# Patient Record
Sex: Female | Born: 1965 | Race: White | Hispanic: No | Marital: Married | Smoking: Never smoker
Health system: Southern US, Community
[De-identification: ages and names within clinical notes are randomized; demographics above are authoritative.]

## PROBLEM LIST (undated history)

## (undated) DIAGNOSIS — G4700 Insomnia, unspecified: Secondary | ICD-10-CM

## (undated) DIAGNOSIS — E1165 Type 2 diabetes mellitus with hyperglycemia: Secondary | ICD-10-CM

## (undated) DIAGNOSIS — E669 Obesity, unspecified: Secondary | ICD-10-CM

## (undated) DIAGNOSIS — J4540 Moderate persistent asthma, uncomplicated: Secondary | ICD-10-CM

## (undated) DIAGNOSIS — M545 Low back pain, unspecified: Secondary | ICD-10-CM

## (undated) DIAGNOSIS — T7840XS Allergy, unspecified, sequela: Secondary | ICD-10-CM

## (undated) DIAGNOSIS — J449 Chronic obstructive pulmonary disease, unspecified: Secondary | ICD-10-CM

## (undated) DIAGNOSIS — E785 Hyperlipidemia, unspecified: Secondary | ICD-10-CM

## (undated) DIAGNOSIS — J309 Allergic rhinitis, unspecified: Secondary | ICD-10-CM

## (undated) DIAGNOSIS — M5000 Cervical disc disorder with myelopathy, unspecified cervical region: Secondary | ICD-10-CM

## (undated) DIAGNOSIS — E7800 Pure hypercholesterolemia, unspecified: Secondary | ICD-10-CM

## (undated) DIAGNOSIS — K2100 Gastro-esophageal reflux disease with esophagitis, without bleeding: Secondary | ICD-10-CM

## (undated) DIAGNOSIS — F419 Anxiety disorder, unspecified: Secondary | ICD-10-CM

## (undated) DIAGNOSIS — F411 Generalized anxiety disorder: Secondary | ICD-10-CM

## (undated) DIAGNOSIS — M25532 Pain in left wrist: Secondary | ICD-10-CM

## (undated) DIAGNOSIS — E119 Type 2 diabetes mellitus without complications: Secondary | ICD-10-CM

## (undated) HISTORY — DX: Anxiety disorder, unspecified: F41.9

## (undated) HISTORY — DX: Morbid (severe) obesity due to excess calories: E66.01

## (undated) HISTORY — DX: Chronic obstructive pulmonary disease, unspecified: J44.9

## (undated) HISTORY — DX: Generalized anxiety disorder: F41.1

## (undated) HISTORY — DX: Cervical disc disorder with myelopathy, unspecified cervical region: M50.00

## (undated) HISTORY — DX: Low back pain, unspecified: M54.50

## (undated) HISTORY — PX: CHOLECYSTECTOMY: SHX55

## (undated) HISTORY — DX: Hyperlipidemia, unspecified: E78.5

## (undated) HISTORY — DX: Type 2 diabetes mellitus without complications: E11.9

## (undated) HISTORY — DX: Insomnia, unspecified: G47.00

## (undated) HISTORY — DX: Allergy, unspecified, sequela: T78.40XS

## (undated) HISTORY — DX: Allergic rhinitis, unspecified: J30.9

## (undated) HISTORY — DX: Pain in left wrist: M25.532

## (undated) HISTORY — DX: Obesity, unspecified: E66.9

## (undated) HISTORY — PX: TYMPANOSTOMY TUBE PLACEMENT: SHX32

## (undated) HISTORY — DX: Low back pain: M54.5

## (undated) HISTORY — DX: Pure hypercholesterolemia, unspecified: E78.00

## (undated) HISTORY — DX: Gastro-esophageal reflux disease with esophagitis, without bleeding: K21.00

## (undated) HISTORY — DX: Type 2 diabetes mellitus with hyperglycemia: E11.65

## (undated) HISTORY — DX: Moderate persistent asthma, uncomplicated: J45.40

---
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
## 2000-02-03 ENCOUNTER — Encounter: Admission: RE | Admit: 2000-02-03 | Discharge: 2000-02-03 | Payer: Self-pay

## 2000-12-29 ENCOUNTER — Emergency Department (HOSPITAL_COMMUNITY): Admission: EM | Admit: 2000-12-29 | Discharge: 2000-12-29 | Payer: Self-pay | Admitting: Emergency Medicine

## 2001-10-07 ENCOUNTER — Encounter: Payer: Self-pay | Admitting: Emergency Medicine

## 2001-10-07 ENCOUNTER — Emergency Department (HOSPITAL_COMMUNITY): Admission: EM | Admit: 2001-10-07 | Discharge: 2001-10-07 | Payer: Self-pay | Admitting: Emergency Medicine

## 2001-12-25 ENCOUNTER — Emergency Department (HOSPITAL_COMMUNITY): Admission: EM | Admit: 2001-12-25 | Discharge: 2001-12-25 | Payer: Self-pay | Admitting: Emergency Medicine

## 2002-11-16 ENCOUNTER — Emergency Department (HOSPITAL_COMMUNITY): Admission: EM | Admit: 2002-11-16 | Discharge: 2002-11-16 | Payer: Self-pay | Admitting: *Deleted

## 2002-11-16 ENCOUNTER — Encounter: Payer: Self-pay | Admitting: *Deleted

## 2003-04-11 ENCOUNTER — Encounter: Payer: Self-pay | Admitting: Orthopedic Surgery

## 2003-06-12 ENCOUNTER — Other Ambulatory Visit: Admission: RE | Admit: 2003-06-12 | Discharge: 2003-06-12 | Payer: Self-pay | Admitting: Obstetrics & Gynecology

## 2004-04-22 ENCOUNTER — Other Ambulatory Visit: Admission: RE | Admit: 2004-04-22 | Discharge: 2004-04-22 | Payer: Self-pay | Admitting: Obstetrics & Gynecology

## 2004-07-06 ENCOUNTER — Emergency Department (HOSPITAL_COMMUNITY): Admission: EM | Admit: 2004-07-06 | Discharge: 2004-07-07 | Payer: Self-pay | Admitting: Emergency Medicine

## 2004-09-09 ENCOUNTER — Ambulatory Visit (HOSPITAL_COMMUNITY): Admission: RE | Admit: 2004-09-09 | Discharge: 2004-09-09 | Payer: Self-pay | Admitting: Family Medicine

## 2005-03-12 ENCOUNTER — Emergency Department (HOSPITAL_COMMUNITY): Admission: EM | Admit: 2005-03-12 | Discharge: 2005-03-12 | Payer: Self-pay | Admitting: Emergency Medicine

## 2006-03-14 ENCOUNTER — Ambulatory Visit: Payer: Self-pay | Admitting: Orthopedic Surgery

## 2006-03-29 ENCOUNTER — Encounter: Admission: RE | Admit: 2006-03-29 | Discharge: 2006-03-29 | Payer: Self-pay | Admitting: Family Medicine

## 2006-08-15 ENCOUNTER — Ambulatory Visit: Payer: Self-pay | Admitting: Orthopedic Surgery

## 2006-10-20 ENCOUNTER — Ambulatory Visit: Payer: Self-pay | Admitting: Orthopedic Surgery

## 2006-12-09 ENCOUNTER — Encounter: Payer: Self-pay | Admitting: Orthopedic Surgery

## 2006-12-29 ENCOUNTER — Ambulatory Visit: Payer: Self-pay | Admitting: Orthopedic Surgery

## 2006-12-29 DIAGNOSIS — J45909 Unspecified asthma, uncomplicated: Secondary | ICD-10-CM | POA: Insufficient documentation

## 2007-09-28 ENCOUNTER — Telehealth: Payer: Self-pay | Admitting: Orthopedic Surgery

## 2007-09-28 ENCOUNTER — Ambulatory Visit: Payer: Self-pay | Admitting: Orthopedic Surgery

## 2007-09-28 DIAGNOSIS — M719 Bursopathy, unspecified: Secondary | ICD-10-CM

## 2007-09-28 DIAGNOSIS — M7580 Other shoulder lesions, unspecified shoulder: Secondary | ICD-10-CM

## 2007-09-28 DIAGNOSIS — M67919 Unspecified disorder of synovium and tendon, unspecified shoulder: Secondary | ICD-10-CM | POA: Insufficient documentation

## 2007-09-28 DIAGNOSIS — M75120 Complete rotator cuff tear or rupture of unspecified shoulder, not specified as traumatic: Secondary | ICD-10-CM | POA: Insufficient documentation

## 2007-11-03 ENCOUNTER — Encounter
Admission: RE | Admit: 2007-11-03 | Discharge: 2007-11-06 | Payer: Self-pay | Admitting: Physical Medicine & Rehabilitation

## 2007-11-06 ENCOUNTER — Ambulatory Visit: Payer: Self-pay | Admitting: Physical Medicine & Rehabilitation

## 2007-11-09 ENCOUNTER — Ambulatory Visit (HOSPITAL_COMMUNITY)
Admission: RE | Admit: 2007-11-09 | Discharge: 2007-11-09 | Payer: Self-pay | Admitting: Physical Medicine & Rehabilitation

## 2009-01-15 ENCOUNTER — Emergency Department (HOSPITAL_COMMUNITY): Admission: EM | Admit: 2009-01-15 | Discharge: 2009-01-15 | Payer: Self-pay | Admitting: Emergency Medicine

## 2010-06-28 ENCOUNTER — Encounter: Payer: Self-pay | Admitting: Family Medicine

## 2010-10-20 NOTE — Group Therapy Note (Signed)
REASON FOR CONSULTATION:  Chronic neck pain.   The patient has also had chronic low back pain, but she states that this  is the lesser of the two problems.  In addition, the patient complains  of right shoulder pain and left ankle as well as bilateral hand  numbness.   The patient has had additional workup of her neck pain back on March 29, 2006.  She had an MRI of the cervical spine demonstrating a small  disk protrusion at C6-C7, which abuts on the ventral aspect of the cord  but no compression, actually looked less prominent than on the exam,  dated September 09, 2004.  Did have some spurring to the right and left  midline at C3-C4 but no evidence of spinal cord stenosis or nerve root  impingement.   In addition, the patient has had workup of her hand numbness.  She  reports having had an EMG and CV, but this is over a year ago.  She now  has more constant symptoms at night despite the use of braces.  She has  never had any carpal tunnel injections.   In terms of her right shoulder, Dr. Romeo Apple does do right shoulder  injections every 3 months.   She also indicates some chronic left ankle as well as knee pain.  She  has not had orthopedic followup for this.  She states she fractured her  left ankle in the past and has had problems related to this chronically.   Her pain is described as sharp and burning into the neck and shoulder  region, some tingling in the left shoulder at times, but more so in the  hands at night.  Her sleep is poor because of her tingling in the hands.  Her pain improves with rest and medications.  She does have the TENS  unit but has not had any new form of physical therapy.  She has not had  any injections for her neck.  She can walk 15 minutes at a time.  She  does not climb steps.  She does not drive.  She needs some assistance  with dressing, bathing, meal prep, household duties, and shopping.   CURRENT MEDICATIONS:  She has been off fentanyl and  Percocet for 2  months per her report.  She states that since she has been off her  medications she has had problems with her dressing, bathing, meal prep,  household duties, and shopping whereas when she was using them, she was  able to do these things.   Additional review of systems is positive for depression and anxiety.  She does not see Psychiatry.  She is not on any antidepressants but is  on alprazolam.  She does report seeing Dr. Seabron Spates who has been  prescribing furosemide, vitamin D, Actos, Nexium, Allegra, and Xopenex.   PAST MEDICAL HISTORY:  Positive for diabetes.   PAST SURGICAL HISTORY:  1. Bilateral myringotomies in 1994.  2. Cholecystectomy on Oct 15, 1997.   SOCIAL HISTORY:  Married.  Lives with her husband.  She denies any  illegal drug use.  No alcohol use or smoking.   FAMILY HISTORY:  1. Heart disease.  2. Diabetes.  3. High blood pressure.   PHYSICAL EXAMINATION:  Blood pressure 126/65, pulse 90, respirations 18,  and O2 saturation 98% on room air.  GENERAL:  In no acute distress.  Mood and affect appropriate.  Orientation x3.  Affect is alert.  Gait is normal.  BACK:  No tenderness to palpation.   She admits to some tenderness over the upper trapezius, upper medial  scapular border, and as well as the cervical paraspinal.  She has  negative impingement sign.  She has full strength bilateral deltoid,  biceps, and triceps as well as with hip flexion, knee extension, and  ankle dorsiflexion.  Deep tendon reflexes are normal.  Sensation is  reduced in the right index finger and the left index finger when  compared to the middle fingers.  She has a positive reverse Phalen's on  the right side at 1 minute.   Gait shows no evidence of toe drag or knee instability.  No gait  instability.  She has normal coordination.  She has no evidence of edema  except some trace pedal on the left side only.   Lumbar range of motion about 50% forward flexion and  extension.   IMPRESSION:  1. Cervical spondylosis without myelopathy.  I do not think that she      has any signs of radiculopathy based on last MRI.  2. Carpal tunnel syndrome.  This may have progressed since her last      EMG.  3. Left knee pain, question osteoarthritis, she is obese.  We would      like to check an x-ray.  4. Right shoulder impingement being followed by Orthopedics.   PLAN:  1. We will check urine drug screen.  Consider reinstitution of      narcotic analgesics but at lower doses.  2. Referral to Physical Therapy for cervical thoracic stabilization.      This will be done at [facility].  3. Left knee x-ray.  4. Repeat EMG and 2D, upper extremities.      Erick Colace, M.D.  Electronically Signed     AEK/MedQ  D:  11/06/2007 11:05:40  T:  11/07/2007 05:38:09  Job #:  [id number]   cc:   Vickki Hearing, M.D.  Fax: [phone]   Galen Daft. Timoteo Gaul, M.D.

## 2011-01-12 ENCOUNTER — Ambulatory Visit (HOSPITAL_COMMUNITY): Payer: Medicaid Other

## 2011-01-18 ENCOUNTER — Ambulatory Visit (HOSPITAL_COMMUNITY)
Admission: RE | Admit: 2011-01-18 | Discharge: 2011-01-18 | Disposition: A | Discharge status: Home / Self Care | Payer: Medicaid Other | Source: Ambulatory Visit | Attending: Pulmonary Disease | Admitting: Pulmonary Disease

## 2011-01-18 DIAGNOSIS — I517 Cardiomegaly: Secondary | ICD-10-CM | POA: Insufficient documentation

## 2011-01-18 DIAGNOSIS — E119 Type 2 diabetes mellitus without complications: Secondary | ICD-10-CM

## 2011-01-18 DIAGNOSIS — Z8673 Personal history of transient ischemic attack (TIA), and cerebral infarction without residual deficits: Secondary | ICD-10-CM | POA: Insufficient documentation

## 2011-01-18 NOTE — Progress Notes (Signed)
*  PRELIMINARY RESULTS* Echocardiogram 2D Echocardiogram has been performed.  Tricia Weiss 01/18/2011, 12:41 PM

## 2011-01-18 NOTE — Progress Notes (Signed)
*  PRELIMINARY RESULTS* Echocardiogram 48 H holtre monitor has been performed.  Conrad Ocean Breeze 01/18/2011, 12:43 PM

## 2011-01-19 ENCOUNTER — Ambulatory Visit (HOSPITAL_COMMUNITY)
Admission: RE | Admit: 2011-01-19 | Discharge: 2011-01-19 | Disposition: A | Discharge status: Home / Self Care | Payer: Medicaid Other | Source: Ambulatory Visit | Attending: Pulmonary Disease | Admitting: Pulmonary Disease

## 2011-02-01 ENCOUNTER — Encounter: Payer: Self-pay | Admitting: Cardiology

## 2011-04-26 ENCOUNTER — Other Ambulatory Visit: Payer: Self-pay | Admitting: Obstetrics & Gynecology

## 2011-04-26 ENCOUNTER — Other Ambulatory Visit (HOSPITAL_COMMUNITY)
Admission: RE | Admit: 2011-04-26 | Discharge: 2011-04-26 | Disposition: A | Discharge status: Home / Self Care | Payer: Medicaid Other | Source: Ambulatory Visit | Attending: Obstetrics & Gynecology | Admitting: Obstetrics & Gynecology

## 2011-04-26 DIAGNOSIS — Z01419 Encounter for gynecological examination (general) (routine) without abnormal findings: Secondary | ICD-10-CM | POA: Insufficient documentation

## 2011-04-26 LAB — HM PAP SMEAR: HM Pap smear: NEGATIVE

## 2011-12-02 ENCOUNTER — Telehealth (HOSPITAL_COMMUNITY): Payer: Self-pay | Admitting: Dietician

## 2011-12-02 LAB — HM DIABETES FOOT EXAM

## 2011-12-02 LAB — VITAMIN D 25 HYDROXY (VIT D DEFICIENCY, FRACTURES): Vit D, 25-Hydroxy: 38

## 2011-12-02 NOTE — Telephone Encounter (Signed)
Received referral via fax from Dr. Juanetta Gosling for dx: obesity, diabetes.

## 2011-12-03 NOTE — Telephone Encounter (Signed)
Appointment scheduled for 12/14/11 at 2:00 PM. Mailed appointment confirmation letter and instructions to pt home via US Mail.  

## 2011-12-14 ENCOUNTER — Encounter (HOSPITAL_COMMUNITY): Payer: Self-pay | Admitting: Dietician

## 2011-12-14 DIAGNOSIS — E7800 Pure hypercholesterolemia, unspecified: Secondary | ICD-10-CM | POA: Insufficient documentation

## 2011-12-14 DIAGNOSIS — E785 Hyperlipidemia, unspecified: Secondary | ICD-10-CM | POA: Insufficient documentation

## 2011-12-14 DIAGNOSIS — J449 Chronic obstructive pulmonary disease, unspecified: Secondary | ICD-10-CM | POA: Insufficient documentation

## 2011-12-14 DIAGNOSIS — F411 Generalized anxiety disorder: Secondary | ICD-10-CM | POA: Insufficient documentation

## 2011-12-14 DIAGNOSIS — G4700 Insomnia, unspecified: Secondary | ICD-10-CM | POA: Insufficient documentation

## 2011-12-14 DIAGNOSIS — M545 Low back pain: Secondary | ICD-10-CM | POA: Insufficient documentation

## 2011-12-14 DIAGNOSIS — E669 Obesity, unspecified: Secondary | ICD-10-CM | POA: Insufficient documentation

## 2011-12-14 DIAGNOSIS — E119 Type 2 diabetes mellitus without complications: Secondary | ICD-10-CM | POA: Insufficient documentation

## 2011-12-14 NOTE — Progress Notes (Signed)
Outpatient Initial Nutrition Assessment  Date:12/14/2011   Time: 2:00 PM  Referring Physician: Dr. Juanetta Gosling Reason for Visit: obesity, diabetes  Nutrition Assessment:  Height: 5' 4.5" (163.8 cm)   Weight: 262 lb (118.842 kg)   IBW: 123# %IBW: 236% UBW: 262# %UBW:  100% Body mass index is 44.28 kg/(m^2).  Goal Weight: 236# (10% weight loss)  Weight hx: Pt desires weight loss. Reports highest weight was 320#. She has made efforts to lose to her current weight.   Estimated nutritional needs: 2070-2258 kcals daily, 95-119 grams protein daily, 2.0-2.3 L fluid daily  PMH:  Past Medical History  Diagnosis Date  . High cholesterol   . Diabetes mellitus   . COPD (chronic obstructive pulmonary disease)   . Anxiety state, unspecified   . Chronic airway obstruction   . Insomnia, unspecified   . Lumbago   . Obesity   . Hyperlipidemia     Medications:  Current Outpatient Rx  Name Route Sig Dispense Refill  . ALPRAZOLAM 1 MG PO TABS Oral Take 1 mg by mouth 4 (four) times daily.    Marland Kitchen ESOMEPRAZOLE MAGNESIUM 40 MG PO CPDR Oral Take 40 mg by mouth 4 (four) times daily.    Marland Kitchen HYDROCODONE-ACETAMINOPHEN 10-325 MG PO TABS Oral Take 1 tablet by mouth 5 (five) times daily.    Marland Kitchen LINAGLIPTIN-METFORMIN HCL 2.5-500 MG PO TABS Oral Take 1 tablet by mouth 2 (two) times daily.       Labs: CMP  No results found for this basename: na, k, cl, co2, glucose, bun, creatinine, calcium, prot, albumin, ast, alt, alkphos, bilitot, gfrnonaa, gfraa    Lipid Panel  No results found for this basename: chol, trig, hdl, cholhdl, vldl, ldlcalc     No results found for this basename: HGBA1C   No results found for this basename: GLUF, MICROALBUR, LDLCALC, CREATININE    Per Dr. Juanetta Gosling records from 12/02/11: NaL 138, K: 4.8, Cl: 103, CO2: 26, BUN: 151, Creat: 0.62, Glucose: 101, Total Cholesterol: 138, HDL: 52, LDL: 73, Triglycerides: 66, Hgb A1c: 6.5   Lifestyle/ social habits: Ms. Gianino lives in [place] with her  step-sister, Nicole Cella (who is present with her today), daughter, granddaughter, and husband. She is disabled. She reports her stress level as a 9, citing taking care of her family members (especially with step-sister with MR and her granddaughter, for whom she is the primary caregiver, and her own daughter, who suffers from mental illness) as her main sources of stress. She is a former smoker. She does not participate in physical activity, due to pain from carpal tunnel, rotator cuff syndrome, and arthritis.   Nutrition hx/habits: Ms. Severs has had diabetes for 10 years. She reports that portion control is her biggest barrier to optimally controlling her weight and diabetes. She reports she has decreased bananas and sweets from her diet. She tries to balance "the healthy way and the easy way" to control her weight and diabetes. She has lost a considerable amount of weight (~58#) by lifestyle changes. She checks her CBGs very diligently (3-4 times per day), report levels in the 120's. She is very interested in learning about portion control to help for lose a goal weight of 100#.  She eats out 3-4 times per week, but makes choices wisely, including baked meat and veggies as part of her meal.   Diet recall: breakfast: egg or Glucerna; Lunch: grilled chicken, green beans, potatoes, corn; Dinner: baked fish or crab cakes, green beans, broccoli, mac and cheese; Snack: cucumbers  Nutrition Diagnosis: Nutrition related knowledge deficit r/t pt with multiple diet related questions about weight loss AEB: BMI: 44.28.   Nutrition Intervention: Nutrition rx: 1500 kcal NAS, diabetic diet; 3 meals per day (45-60 grams carbohydrates per meal); 1 snack (15-30 grams carbohydrate per snack); low calorie beverages only; physical activity as tolerated  Education/Counseling Provided: Educated pt and pt sister on diabetic diet principles. Emphasized plate method, sources of carbohydrate, portion sizes, carbohydrate counting, and  healthy food preparation strategies. Discussed nutritional value of foods commonly eaten. Discussed importance of regular physical activity to optimize weight and glycemic control. Encouraged slow, moderate weight loss through lifestyle changes. Provided plate method handout.   Understanding, Motivation, Ability to Follow Recommendations: Expect fair to good compliance.   Monitoring and Evaluation: Goals: 1)1-2# weight loss per week; 2) Hgb A1c < 7.0, 3) Physical activity as tolerated  Recommendations: 1) For weight loss: 1570-1758 kcals daily; 2) Choose an enjoyable exercise; 3) Break up exercise into smaller, more frequent sessions; 4) Look into local resources for fitness centers (ex. [employer]); 5) Use measuring cups and/or pre-portioned snacks (ex. Fruit cups, yogurt) to ensure proper portion sizes; 6) Keep food diary   F/U: PRN. Provided RD contact information.   Orlene Plum, RD  12/14/2011  Time: 2:00 PM

## 2011-12-15 ENCOUNTER — Other Ambulatory Visit (HOSPITAL_COMMUNITY): Payer: Self-pay | Admitting: Pulmonary Disease

## 2011-12-15 DIAGNOSIS — M542 Cervicalgia: Secondary | ICD-10-CM

## 2011-12-15 DIAGNOSIS — M79603 Pain in arm, unspecified: Secondary | ICD-10-CM

## 2011-12-17 ENCOUNTER — Ambulatory Visit (HOSPITAL_COMMUNITY): Payer: Medicaid Other

## 2011-12-24 ENCOUNTER — Ambulatory Visit (HOSPITAL_COMMUNITY)
Admission: RE | Admit: 2011-12-24 | Discharge: 2011-12-24 | Disposition: A | Discharge status: Home / Self Care | Payer: Medicaid Other | Source: Ambulatory Visit | Attending: Pulmonary Disease | Admitting: Pulmonary Disease

## 2011-12-24 DIAGNOSIS — M79603 Pain in arm, unspecified: Secondary | ICD-10-CM

## 2011-12-24 DIAGNOSIS — M79609 Pain in unspecified limb: Secondary | ICD-10-CM | POA: Insufficient documentation

## 2011-12-24 DIAGNOSIS — M542 Cervicalgia: Secondary | ICD-10-CM | POA: Insufficient documentation

## 2011-12-24 DIAGNOSIS — M5030 Other cervical disc degeneration, unspecified cervical region: Secondary | ICD-10-CM | POA: Insufficient documentation

## 2014-01-21 ENCOUNTER — Ambulatory Visit (HOSPITAL_COMMUNITY)
Admission: RE | Admit: 2014-01-21 | Discharge: 2014-01-21 | Disposition: A | Discharge status: Home / Self Care | Payer: Medicaid Other | Source: Ambulatory Visit | Attending: Pulmonary Disease | Admitting: Pulmonary Disease

## 2014-01-21 DIAGNOSIS — M542 Cervicalgia: Secondary | ICD-10-CM | POA: Diagnosis present

## 2014-01-21 DIAGNOSIS — IMO0001 Reserved for inherently not codable concepts without codable children: Secondary | ICD-10-CM | POA: Insufficient documentation

## 2014-01-21 DIAGNOSIS — M719 Bursopathy, unspecified: Secondary | ICD-10-CM | POA: Diagnosis not present

## 2014-01-21 DIAGNOSIS — M67919 Unspecified disorder of synovium and tendon, unspecified shoulder: Secondary | ICD-10-CM | POA: Diagnosis not present

## 2014-01-21 DIAGNOSIS — M75120 Complete rotator cuff tear or rupture of unspecified shoulder, not specified as traumatic: Secondary | ICD-10-CM | POA: Diagnosis not present

## 2014-01-21 DIAGNOSIS — M25819 Other specified joint disorders, unspecified shoulder: Secondary | ICD-10-CM | POA: Insufficient documentation

## 2014-01-21 DIAGNOSIS — M7580 Other shoulder lesions, unspecified shoulder: Secondary | ICD-10-CM

## 2014-01-21 NOTE — Evaluation (Signed)
Physical Therapy Evaluation  Patient Details  Name: Tricia Weiss MRN: [id number] Date of Birth: 1965-08-24  Today's Date: 01/21/2014 Time: 1345-1430 PT Time Calculation (min): 45 min              Visit#: 1 of 1   Past Medical History:  Past Medical History  Diagnosis Date  . High cholesterol   . Diabetes mellitus   . COPD (chronic obstructive pulmonary disease)   . Anxiety state, unspecified   . Chronic airway obstruction   . Insomnia, unspecified   . Lumbago   . Obesity   . Hyperlipidemia    Past Surgical History:  Past Surgical History  Procedure Laterality Date  . Cholecystectomy    . Tympanostomy tube placement      Subjective Symptoms/Limitations Symptoms: Ms. Tricia Weiss states that her neck has been bothering her for about nine years now.  The pain has been progressive.  She states that she was told that she needed surgery but the Dr. said he would call her and never did, (that was a year and a half ago).  She has a TENS unit that helps some,  She has difficulty sleeping stating that she is only getting 3-5 hours of sleep a night.  Pertinent History: Chronic C6-C7 disc degeneration.  Mild progression now resulting in spinal stenosis with minor mass effect on the spinal cord (no cord effect); B carpal tunnel and Rt rotator cuff injury.   How long can you sit comfortably?: needs to shift and move around  Pain Assessment Currently in Pain?: Yes Pain Score: 4  Pain Location: Neck Pain Orientation: Right;Left Pain Type: Chronic pain Pain Frequency: Constant Pain Relieving Factors: sitting up straight   Assessment Cervical AROM Cervical Flexion: wnl no pain with reps Cervical Extension: wnl no pain with reps  Cervical - Right Side Bend: wfl no pain with reps Cervical - Left Side Bend: wfl no pain with reps  Cervical - Right Rotation: decreased 20%  Cervical - Left Rotation: decreased 10% Cervical Strength Cervical Extension: 2/5 Cervical - Right Side Bend:  2/5 Cervical - Left Side Bend: 2/5  Exercise/Treatments      Seated Exercises Cervical Isometrics: 5 reps Neck Retraction: 5 reps Shoulder Shrugs: 5 reps Other Seated Exercise: scapular retraction x 5      Physical Therapy Assessment and Plan PT Assessment and Plan Clinical Impression Statement: Pt is a 48 yo female with hx of chronic cervical pain.  Pt insurance allows one time treatment only.  Pt was educated on the importance of proper posture as well as proper body mechanics.  Pt was given a HEP for posture and strengthening. Pt will benefit from skilled therapeutic intervention in order to improve on the following deficits: Pain PT Frequency:  (one time only secondary to insurance ) PT Treatment/Interventions: Therapeutic exercise PT Plan: D/C to HEP    Goals Home Exercise Program Pt/caregiver will Perform Home Exercise Program: For increased strengthening PT Goal: Perform Home Exercise Program - Progress: Goal set today  Problem List Patient Active Problem List   Diagnosis Date Noted  . Cervical pain 01/21/2014  . High cholesterol   . Diabetes mellitus   . COPD (chronic obstructive pulmonary disease)   . Anxiety state, unspecified   . Chronic airway obstruction   . Insomnia, unspecified   . Lumbago   . Obesity   . Hyperlipidemia   . BURSITIS, SHOULDER 09/28/2007  . IMPINGEMENT SYNDROME 09/28/2007  . RUPTURE ROTATOR CUFF 09/28/2007  . ASTHMA 12/29/2006  GP    Tricia Weiss,Tricia Weiss 01/21/2014, 5:32 PM  Physician Documentation Your signature is required to indicate approval of the treatment plan as stated above.  Please sign and either send electronically or make a copy of this report for your files and return this physician signed original.   Please mark one 1.__approve of plan  2. ___approve of plan with the following conditions.   ______________________________                                                          _____________________ Physician  Signature                                                                                                             Date

## 2014-04-25 ENCOUNTER — Other Ambulatory Visit: Payer: Self-pay | Admitting: Adult Health

## 2014-05-28 ENCOUNTER — Other Ambulatory Visit: Payer: Self-pay | Admitting: Adult Health

## 2014-07-10 ENCOUNTER — Other Ambulatory Visit (HOSPITAL_COMMUNITY): Payer: Self-pay | Admitting: Pulmonary Disease

## 2014-07-10 DIAGNOSIS — Z780 Asymptomatic menopausal state: Secondary | ICD-10-CM

## 2014-07-15 ENCOUNTER — Other Ambulatory Visit (HOSPITAL_COMMUNITY): Payer: Medicaid Other

## 2014-07-17 ENCOUNTER — Other Ambulatory Visit (HOSPITAL_COMMUNITY): Payer: Medicaid Other

## 2015-08-28 ENCOUNTER — Ambulatory Visit (HOSPITAL_COMMUNITY)
Admission: RE | Admit: 2015-08-28 | Discharge: 2015-08-28 | Disposition: A | Discharge status: Home / Self Care | Payer: Medicaid Other | Source: Ambulatory Visit | Attending: Pulmonary Disease | Admitting: Pulmonary Disease

## 2015-08-28 ENCOUNTER — Other Ambulatory Visit (HOSPITAL_COMMUNITY): Payer: Self-pay | Admitting: Pulmonary Disease

## 2015-08-28 DIAGNOSIS — M25532 Pain in left wrist: Secondary | ICD-10-CM | POA: Insufficient documentation

## 2015-08-28 DIAGNOSIS — M79642 Pain in left hand: Secondary | ICD-10-CM

## 2015-08-28 DIAGNOSIS — M542 Cervicalgia: Secondary | ICD-10-CM

## 2015-09-05 ENCOUNTER — Ambulatory Visit (HOSPITAL_COMMUNITY): Payer: Medicaid Other

## 2015-09-17 ENCOUNTER — Telehealth: Payer: Self-pay

## 2015-09-17 ENCOUNTER — Telehealth: Payer: Self-pay | Admitting: Orthopedic Surgery

## 2015-09-17 NOTE — Telephone Encounter (Signed)
Patient returned call; appointment scheduled with Dr Romeo AppleHarrison per referral from Dr Juanetta GoslingHawkins; patient aware of appointment.

## 2015-09-17 NOTE — Telephone Encounter (Signed)
Left message for patient to call office to schedule appointment for left hand pain.  Referral in drawer.

## 2015-09-24 ENCOUNTER — Ambulatory Visit (HOSPITAL_COMMUNITY)
Admission: RE | Admit: 2015-09-24 | Discharge: 2015-09-24 | Disposition: A | Discharge status: Home / Self Care | Payer: Medicaid Other | Source: Ambulatory Visit | Attending: Pulmonary Disease | Admitting: Pulmonary Disease

## 2015-09-24 DIAGNOSIS — M5382 Other specified dorsopathies, cervical region: Secondary | ICD-10-CM | POA: Diagnosis not present

## 2015-09-24 DIAGNOSIS — M542 Cervicalgia: Secondary | ICD-10-CM | POA: Diagnosis present

## 2015-10-01 ENCOUNTER — Ambulatory Visit (INDEPENDENT_AMBULATORY_CARE_PROVIDER_SITE_OTHER): Payer: Medicaid Other | Admitting: Orthopedic Surgery

## 2015-10-01 ENCOUNTER — Encounter: Payer: Self-pay | Admitting: Orthopedic Surgery

## 2015-10-01 VITALS — BP 157/85 | HR 97 | Ht 64.0 in | Wt 250.0 lb

## 2015-10-01 DIAGNOSIS — M1812 Unilateral primary osteoarthritis of first carpometacarpal joint, left hand: Secondary | ICD-10-CM | POA: Diagnosis not present

## 2015-10-01 NOTE — Progress Notes (Signed)
Chief Complaint  Patient presents with  . Hand Pain    LEFT HAND PAIN AND SWELLING   HPI  50 years old presents for evaluation of left hand  Complains of left hand pain since 08/19/2015 no injury  Complains of 5 out of 10 pain and swelling with giving way symptoms throbbing and aching over the left thumb. She did wear a wrist brace did not help she is taking her regular pain medication which also does not help  Pain seems to be near the [facility] joint of the thumb and is exacerbated by altered deviation of the wrist    Review of Systems  HENT: Positive for hearing loss.   Musculoskeletal: Positive for neck pain.  Neurological: Negative for tingling and focal weakness.  Endo/Heme/Allergies: Positive for environmental allergies.  Psychiatric/Behavioral: The patient is nervous/anxious.     Past Medical History  Diagnosis Date  . High cholesterol   . Diabetes mellitus (HCC)   . COPD (chronic obstructive pulmonary disease) (HCC)   . Anxiety state, unspecified   . Chronic airway obstruction (HCC)   . Insomnia, unspecified   . Lumbago   . Obesity   . Hyperlipidemia     Past Surgical History  Procedure Laterality Date  . Cholecystectomy    . Tympanostomy tube placement     No family history on file. Social History  Substance Use Topics  . Smoking status: Former Games developermoker  . Smokeless tobacco: None  . Alcohol Use: No    Current outpatient prescriptions:  .  ALPRAZolam (XANAX) 1 MG tablet, Take 1 mg by mouth 4 (four) times daily., Disp: , Rfl:  .  esomeprazole (NEXIUM) 40 MG capsule, Take 40 mg by mouth 4 (four) times daily., Disp: , Rfl:  .  HYDROcodone-acetaminophen (NORCO) 10-325 MG per tablet, Take 1 tablet by mouth 5 (five) times daily., Disp: , Rfl:  .  Linagliptin-Metformin HCl (JENTADUETO) 2.5-500 MG TABS, Take 1 tablet by mouth 2 (two) times daily. , Disp: , Rfl:   BP 157/85 mmHg  Pulse 97  Ht 5\' 4"  (1.626 m)  Wt 250 lb (113.399 kg)  BMI 42.89 kg/m2  Physical Exam   Constitutional: She is oriented to person, place, and time. Vital signs are normal. She appears well-developed and well-nourished. She is active. She does not have a sickly appearance. She does not appear ill. No distress.  Cardiovascular: Normal rate and intact distal pulses.   Neurological: She is alert and oriented to person, place, and time. She has normal reflexes. She exhibits normal muscle tone. Coordination normal.  Skin: Skin is warm and dry. No rash noted. She is not diaphoretic. No erythema. No pallor.  Psychiatric: She has a normal mood and affect. Her behavior is normal. Judgment and thought content normal.    Ortho Exam Left hand wrist swelling over the left thumb painful range of motion of this CMC joint and painful pinch and weak pinch no instability skin normal good distal pulses and capillary refill normal sensation epitrochlear lymph nodes are negative  ASSESSMENT: My personal interpretation of the images:  The x-rays shows some mild arthritis of the [facility] joint    PLAN Encounter Diagnosis  Name Primary?  . Primary osteoarthritis of first carpometacarpal joint of left hand Yes    Thumb splint 6 weeks

## 2015-10-24 ENCOUNTER — Other Ambulatory Visit: Payer: Self-pay | Admitting: Neurological Surgery

## 2015-11-12 ENCOUNTER — Ambulatory Visit (INDEPENDENT_AMBULATORY_CARE_PROVIDER_SITE_OTHER): Payer: Medicaid Other | Admitting: Orthopedic Surgery

## 2015-11-12 ENCOUNTER — Encounter: Payer: Self-pay | Admitting: Orthopedic Surgery

## 2015-11-12 VITALS — BP 126/77 | HR 102 | Ht 64.0 in | Wt 257.8 lb

## 2015-11-12 DIAGNOSIS — M1812 Unilateral primary osteoarthritis of first carpometacarpal joint, left hand: Secondary | ICD-10-CM | POA: Diagnosis not present

## 2015-11-12 NOTE — Patient Instructions (Signed)
You have received an injection of steroids into the joint. 15% of patients will have increased pain within the 24 hours postinjection.   This is transient and will go away.   We recommend that you use ice packs on the injection site for 20 minutes every 2 hours and extra strength Tylenol 2 tablets every 8 as needed until the pain resolves.  If you continue to have pain after taking the Tylenol and using the ice please call the office for further instructions.   KEEP WEARING SPLINT AND ICE

## 2015-11-12 NOTE — Progress Notes (Signed)
Patient ID: [name], female   DOB: 11-Jul-1965, 50 y.o.   MRN: [id number]  Chief Complaint  Patient presents with  . Follow-up    OA LEFT THUMB CMC    HPI HPI  PREVIOUSLY 50 years old presents for evaluation of left hand  Complains of left hand pain since 08/19/2015 no injury  Complains of 5 out of 10 pain and swelling with giving way symptoms throbbing and aching over the left thumb. She did wear a wrist brace did not help she is taking her regular pain medication which also does not help  Pain seems to be near the [facility] joint of the thumb and is exacerbated by altered deviation of the wrist   ROS UPDATED 10/01/15 NO CHANGES   Review of Systems  HENT: Positive for hearing loss.   Musculoskeletal: Positive for neck pain.  Neurological: Negative for tingling and focal weakness.  Endo/Heme/Allergies: Positive for environmental allergies.  Psychiatric/Behavioral: The patient is nervous/anxious.     TODAY NO CHANGE AFTER 6 WEEKS IN SPLINT    BP 126/77 mmHg  Pulse 102  Ht 5\' 4"  (1.626 m)  Wt 257 lb 12.8 oz (116.937 kg)  BMI 44.23 kg/m2 Gen. appearance is normal grooming and hygiene Orientation to person place and time normal Mood normal Gait is normal YES No peripheral edema or swelling is noted in the LEFT ARM  Sensory exam shows normal sensation to palpation, pressure and soft touch Skin exam no lacerations ulcerations or erythema  Ortho Exam  HAND: LEFT THUMB CMC TENDER 1ST EXT COMPARTMENT NOT TENDER WEAK PAINFUL PINCH    A/P  Medical decision-making  Encounter Diagnosis  Name Primary?  . Primary osteoarthritis of first carpometacarpal joint of left hand Yes    INJECTION CMC LT THUMB  Left  thumb injection CMC Medication  1 mL of 40 mg Depo-Medrol  2 mL of 1% lidocaine plain  Ethyl chloride for anesthesia  Verbal consent was obtained timeout was taken to confirm the injection site as left thumb  Alcohol was used to prepare the skin along with  ethyl chloride and then the injection was made at the [facility] JOINT    Fuller CanadaStanley Sherrine Salberg, MD 11/12/2015 4:10 PM

## 2015-11-28 ENCOUNTER — Encounter (HOSPITAL_COMMUNITY): Admission: RE | Payer: Self-pay | Source: Ambulatory Visit

## 2015-11-28 ENCOUNTER — Ambulatory Visit (HOSPITAL_COMMUNITY): Admission: RE | Admit: 2015-11-28 | Payer: Medicaid Other | Source: Ambulatory Visit | Admitting: Neurological Surgery

## 2015-11-28 SURGERY — ANTERIOR CERVICAL DECOMPRESSION/DISCECTOMY FUSION 1 LEVEL
Anesthesia: General

## 2015-12-11 ENCOUNTER — Ambulatory Visit (INDEPENDENT_AMBULATORY_CARE_PROVIDER_SITE_OTHER): Payer: Medicaid Other | Admitting: Orthopedic Surgery

## 2015-12-11 ENCOUNTER — Encounter: Payer: Self-pay | Admitting: Orthopedic Surgery

## 2015-12-11 VITALS — BP 138/78 | HR 86 | Temp 97.5°F | Ht 64.0 in | Wt 257.4 lb

## 2015-12-11 DIAGNOSIS — M1812 Unilateral primary osteoarthritis of first carpometacarpal joint, left hand: Secondary | ICD-10-CM | POA: Diagnosis not present

## 2015-12-11 NOTE — Progress Notes (Signed)
Follow-up visit  Chief complaint recheck left thumb osteoarthritis of the joint  Patient has been treated with one cortisone injection and brace wear for greater than 6 weeks  Comes back complaining of continued pain but good pain relief for 3 weeks with the injection  She does not want have surgery  Review of systems she does not have any numbness or tingling in the thumb  BP 138/78 mmHg  Pulse 86  Temp(Src) 97.5 F (36.4 C)  Ht 5\' 4"  (1.626 m)  Wt 257 lb 6 oz (116.745 kg)  BMI 44.16 kg/m2 Normal radial pulse normal color in the left hand tenderness at the base the joint grind test positive normal range of motion in the thumb normal stability slight weakness of pinch normal muscle tone normal skin over the thumb  After discussion of possible treatment options she would like to try another injection  Injection left thumb CMC joint Verbal consent was given Timeout was completed to confirm left thumb CMC joint as injection site Medications: #1 lidocaine 1% 3 mL #2. 40 mg of Depo-Medrol Injection technique: Alcohol prep and ethyl chloride prep CMC joint injected case  Follow-up 6 weeks  Continue brace wear

## 2015-12-11 NOTE — Patient Instructions (Signed)

## 2016-01-22 ENCOUNTER — Ambulatory Visit (INDEPENDENT_AMBULATORY_CARE_PROVIDER_SITE_OTHER): Payer: Medicaid Other | Admitting: Orthopedic Surgery

## 2016-01-22 DIAGNOSIS — M1812 Unilateral primary osteoarthritis of first carpometacarpal joint, left hand: Secondary | ICD-10-CM | POA: Diagnosis not present

## 2016-01-22 NOTE — Patient Instructions (Signed)
Will refer to hand specialist Dr Roda ShuttersXu, [facility]

## 2016-01-22 NOTE — Progress Notes (Signed)
Patient ID: Tricia Weiss, female   DOB: 10-29-1965, 50 y.o.   MRN: [id number]  Chief complaint pain left thumb  HPI Tricia Weiss is a 50 y.o. female.  50 year old female treated for away left basilar joint of the thumb with splinting, multiple injections and NSAID therapy rest and activity modification HPI She has not gotten relief   Review of Systems Review of Systems Normal neuro  Denies fever   Past Medical History:  Diagnosis Date  . Anxiety state, unspecified   . Chronic airway obstruction (HCC)   . COPD (chronic obstructive pulmonary disease) (HCC)   . Diabetes mellitus (HCC)   . High cholesterol   . Hyperlipidemia   . Insomnia, unspecified   . Lumbago   . Obesity      Examination There were no vitals taken for this visit.  Gen. appearance the patient's appearance is normal with normal grooming and hygiene The patient is oriented to person place and time Mood and affect are normal   Ortho Exam  Pain and tenderness over the left thumb decreased range of motion positive grind test week pinch no sensory abnormalities in the digit good color and capillary refill  Axillary lymph nodes are negative   Medical decision-making  Diagnosis basilar joint arthritis  Data  Plan (risk)  Recommend continue splinting and referral to hand surgery for consideration for surgery   Fuller CanadaStanley Elanna Bert, MD 01/22/2016 4:37 PM

## 2016-01-28 ENCOUNTER — Telehealth: Payer: Self-pay | Admitting: *Deleted

## 2016-01-28 NOTE — Telephone Encounter (Signed)
At office visit last week, Dr Romeo AppleHarrison recommended referral to hand specialist Dr Roda ShuttersXu at [facility]. Patients insurance requires referral from PCP. Last office note with recommendation faxed to Dr Juanetta GoslingHawkins via Epic.

## 2016-04-05 ENCOUNTER — Ambulatory Visit (INDEPENDENT_AMBULATORY_CARE_PROVIDER_SITE_OTHER): Payer: Medicaid Other | Admitting: Orthopaedic Surgery

## 2016-04-05 ENCOUNTER — Encounter (INDEPENDENT_AMBULATORY_CARE_PROVIDER_SITE_OTHER): Payer: Self-pay | Admitting: Orthopaedic Surgery

## 2016-04-05 VITALS — Ht 64.0 in | Wt 250.0 lb

## 2016-04-05 DIAGNOSIS — M1812 Unilateral primary osteoarthritis of first carpometacarpal joint, left hand: Secondary | ICD-10-CM | POA: Diagnosis not present

## 2016-04-05 MED ORDER — DICLOFENAC SODIUM 1 % TD GEL: 2.0000 g | Freq: Four times a day (QID) | TRANSDERMAL | 5 refills | Status: DC

## 2016-04-05 MED ORDER — MELOXICAM 7.5 MG PO TABS: 7.5000 mg | ORAL_TABLET | Freq: Two times a day (BID) | ORAL | 2 refills | Status: DC | PRN

## 2016-04-05 NOTE — Progress Notes (Signed)
Office Visit Note   Patient: Tricia Weiss           Date of Birth: 1966/03/06           MRN: [id number] Visit Date: 04/05/2016              Requested by: Kari BaarsEdward Hawkins, MD [address] [place], [place] [id number] PCP: Fredirick MaudlinHAWKINS,EDWARD L, MD   Assessment & Plan: Visit Diagnoses:  1. Arthritis of carpometacarpal (CMC) joint of left thumb     Plan:  - xrays reviewed shows slight erosions of base of 2nd metacarpal - recommend MRI to fully evaluate the extent of arthritis and lesion - f/u after MRI  Follow-Up Instructions: Return in about 2 weeks (around 04/19/2016) for review MRI.   Orders:  Orders Placed This Encounter  Procedures  . MR HAND LEFT WO CONTRAST   Meds ordered this encounter  Medications  . meloxicam (MOBIC) 7.5 MG tablet    Sig: Take 1 tablet (7.5 mg total) by mouth 2 (two) times daily as needed for pain.    Dispense:  30 tablet    Refill:  2  . diclofenac sodium (VOLTAREN) 1 % GEL    Sig: Apply 2 g topically 4 (four) times daily.    Dispense:  1 Tube    Refill:  5      Procedures: No procedures performed   Clinical Data: No additional findings.   Subjective: Chief Complaint  Patient presents with  . Left Hand - Pain    Ms. Tricia Weiss is 50 yo female with many month h/o left hand and thumb pain.  Radiates up radial aspect of wrist.  Pain with grasping, pinching, squeezing.  Feels like a throbbing pain with tingling and sharp pain.  Take norco for pain with partial relief.  Pain since march.  Has worn thumb spica brace with some relief.  Has had 2 injections by Dr. Romeo AppleHarrison with full but temporary relief.      Review of Systems  Constitutional: Negative.   HENT: Negative.   Eyes: Negative.   Respiratory: Negative.   Cardiovascular: Negative.   Endocrine: Negative.   Musculoskeletal: Negative.   Neurological: Negative.   Hematological: Negative.   Psychiatric/Behavioral: Negative.   All other systems reviewed and are  negative.    Objective: Vital Signs: Ht 5\' 4"  (1.626 m)   Wt 250 lb (113.4 kg)   BMI 42.91 kg/m   Physical Exam  Constitutional: She is oriented to person, place, and time. She appears well-developed and well-nourished.  HENT:  Head: Atraumatic.  Eyes: EOM are normal.  Neck: Neck supple.  Cardiovascular: Intact distal pulses.   Pulmonary/Chest: Effort normal.  Abdominal: Soft.  Neurological: She is alert and oriented to person, place, and time.  Skin: Skin is warm. Capillary refill takes less than 2 seconds.  Psychiatric: She has a normal mood and affect. Her behavior is normal. Judgment and thought content normal.  Nursing note and vitals reviewed.   Left Hand Exam   Range of Motion  The patient has normal left wrist ROM.  Muscle Strength  The patient has normal left wrist strength.  Tests  Phalen's Sign: negative Tinel's Sign (Medial Nerve): negative Finkelstein: negative  Comments:  Tender in first webspace with mild swelling.  Negative grind test      Specialty Comments:  No specialty comments available.  Imaging: No results found.   PMFS History: Patient Active Problem List   Diagnosis Date Noted  .  Arthritis of carpometacarpal [facility](CMC) joint of left thumb 04/05/2016  . Cervical pain 01/21/2014  . High cholesterol   . Diabetes mellitus (HCC)   . COPD (chronic obstructive pulmonary disease) (HCC)   . Anxiety state, unspecified   . Chronic airway obstruction (HCC)   . Insomnia, unspecified   . Lumbago   . Obesity   . Hyperlipidemia   . BURSITIS, SHOULDER 09/28/2007  . IMPINGEMENT SYNDROME 09/28/2007  . RUPTURE ROTATOR CUFF 09/28/2007  . ASTHMA 12/29/2006   Past Medical History:  Diagnosis Date  . Anxiety state, unspecified   . Chronic airway obstruction (HCC)   . COPD (chronic obstructive pulmonary disease) (HCC)   . Diabetes mellitus (HCC)   . High cholesterol   . Hyperlipidemia   . Insomnia, unspecified   . Lumbago   . Obesity     No  family history on file.  Past Surgical History:  Procedure Laterality Date  . CHOLECYSTECTOMY    . TYMPANOSTOMY TUBE PLACEMENT     Social History   Occupational History  . Not on file.   Social History Main Topics  . Smoking status: Never Smoker  . Smokeless tobacco: Current User  . Alcohol use No  . Drug use: No  . Sexual activity: Not on file

## 2016-05-02 ENCOUNTER — Ambulatory Visit
Admission: RE | Admit: 2016-05-02 | Discharge: 2016-05-02 | Disposition: A | Discharge status: Home / Self Care | Payer: Medicaid Other | Source: Ambulatory Visit | Attending: Orthopaedic Surgery | Admitting: Orthopaedic Surgery

## 2016-05-02 DIAGNOSIS — M1812 Unilateral primary osteoarthritis of first carpometacarpal joint, left hand: Secondary | ICD-10-CM

## 2016-05-21 ENCOUNTER — Ambulatory Visit (INDEPENDENT_AMBULATORY_CARE_PROVIDER_SITE_OTHER): Payer: Medicaid Other | Admitting: Orthopaedic Surgery

## 2016-05-25 ENCOUNTER — Encounter (INDEPENDENT_AMBULATORY_CARE_PROVIDER_SITE_OTHER): Payer: Self-pay | Admitting: Orthopaedic Surgery

## 2016-05-25 ENCOUNTER — Ambulatory Visit (INDEPENDENT_AMBULATORY_CARE_PROVIDER_SITE_OTHER): Payer: Medicaid Other | Admitting: Orthopaedic Surgery

## 2016-05-25 DIAGNOSIS — M1812 Unilateral primary osteoarthritis of first carpometacarpal joint, left hand: Secondary | ICD-10-CM | POA: Diagnosis not present

## 2016-05-25 NOTE — Progress Notes (Signed)
   Office Visit Note   Patient: Tricia Weiss           Date of Birth: [birth year]           MRN: [id number] Visit Date: 05/25/2016              Requested by: Kari BaarsEdward Hawkins, MD [address] [place], [place] [id number] PCP: Fredirick MaudlinHAWKINS,EDWARD L, MD   Assessment & Plan: Visit Diagnoses:  1. Arthritis of carpometacarpal (CMC) joint of left thumb     Plan: MRI shows erosions at the base of the second metacarpal and changes consistent with CMC arthropathy. We review the MRI results with the patient and discussed treatment options. At this point patient wants to continue to try conservative treatment with rest and bracing. She will certainly give me a call once she feels that she is ready for surgery. We discussed risks benefits alternatives surgery and she understands. I'll see her back as needed  Follow-Up Instructions: Return if symptoms worsen or fail to improve.   Orders:  No orders of the defined types were placed in this encounter.  No orders of the defined types were placed in this encounter.     Procedures: No procedures performed   Clinical Data: No additional findings.   Subjective: Chief Complaint  Patient presents with  . Left Hand - Pain, Follow-up    Patient follows up for MRI of the left hand. She continues to have chronic achy pain that is worse with activity and use of the hand.    Review of Systems   Objective: Vital Signs: There were no vitals taken for this visit.  Physical Exam  Ortho Exam Exam of the left hand is stable with positive grind test. Specialty Comments:  No specialty comments available.  Imaging: No results found.   PMFS History: Patient Active Problem List   Diagnosis Date Noted  . Arthritis of carpometacarpal [facility](CMC) joint of left thumb 04/05/2016  . Cervical pain 01/21/2014  . High cholesterol   . Diabetes mellitus (HCC)   . COPD (chronic obstructive pulmonary disease) (HCC)   . Anxiety state,  unspecified   . Chronic airway obstruction (HCC)   . Insomnia, unspecified   . Lumbago   . Obesity   . Hyperlipidemia   . BURSITIS, SHOULDER 09/28/2007  . IMPINGEMENT SYNDROME 09/28/2007  . RUPTURE ROTATOR CUFF 09/28/2007  . ASTHMA 12/29/2006   Past Medical History:  Diagnosis Date  . Anxiety state, unspecified   . Chronic airway obstruction (HCC)   . COPD (chronic obstructive pulmonary disease) (HCC)   . Diabetes mellitus (HCC)   . High cholesterol   . Hyperlipidemia   . Insomnia, unspecified   . Lumbago   . Obesity     No family history on file.  Past Surgical History:  Procedure Laterality Date  . CHOLECYSTECTOMY    . TYMPANOSTOMY TUBE PLACEMENT     Social History   Occupational History  . Not on file.   Social History Main Topics  . Smoking status: Never Smoker  . Smokeless tobacco: Current User  . Alcohol use No  . Drug use: No  . Sexual activity: Not on file

## 2016-10-12 ENCOUNTER — Other Ambulatory Visit (INDEPENDENT_AMBULATORY_CARE_PROVIDER_SITE_OTHER): Payer: Self-pay | Admitting: Orthopaedic Surgery

## 2016-10-12 NOTE — Telephone Encounter (Signed)
Please advise 

## 2016-12-10 ENCOUNTER — Other Ambulatory Visit (HOSPITAL_COMMUNITY): Payer: Self-pay | Admitting: Pulmonary Disease

## 2016-12-10 DIAGNOSIS — R079 Chest pain, unspecified: Secondary | ICD-10-CM

## 2016-12-14 ENCOUNTER — Ambulatory Visit (HOSPITAL_COMMUNITY): Payer: Medicaid Other | Attending: Pulmonary Disease

## 2016-12-28 ENCOUNTER — Encounter: Payer: Self-pay | Admitting: Cardiovascular Disease

## 2017-03-28 ENCOUNTER — Ambulatory Visit (INDEPENDENT_AMBULATORY_CARE_PROVIDER_SITE_OTHER): Payer: Self-pay | Admitting: Orthopaedic Surgery

## 2017-04-07 ENCOUNTER — Other Ambulatory Visit (INDEPENDENT_AMBULATORY_CARE_PROVIDER_SITE_OTHER): Payer: Self-pay | Admitting: Orthopaedic Surgery

## 2017-04-07 NOTE — Telephone Encounter (Signed)
approve

## 2017-05-09 ENCOUNTER — Other Ambulatory Visit (INDEPENDENT_AMBULATORY_CARE_PROVIDER_SITE_OTHER): Payer: Self-pay | Admitting: Orthopaedic Surgery

## 2017-05-09 NOTE — Telephone Encounter (Signed)
Rx request 

## 2017-09-13 LAB — HEPATIC FUNCTION PANEL
ALT: 19 (ref 7–35)
AST: 16 (ref 13–35)
Alkaline Phosphatase: 55 (ref 25–125)

## 2017-09-13 LAB — BASIC METABOLIC PANEL
BUN: 12 (ref 4–21)
CO2: 31 — AB (ref 13–22)
Chloride: 102 (ref 99–108)
Creatinine: 0.7 (ref <=1.1)
Glucose: 169
Potassium: 4.7 (ref 3.4–5.3)
Sodium: 139 (ref 137–147)

## 2017-09-13 LAB — LIPID PANEL
Cholesterol: 160 (ref 0–200)
HDL: 52 (ref 35–70)
LDL Cholesterol: 87
Triglycerides: 109 (ref 40–160)

## 2017-09-13 LAB — COMPREHENSIVE METABOLIC PANEL
Albumin: 4.1 (ref 3.5–5.0)
Calcium: 9.8 (ref 8.7–10.7)
GFR calc Af Amer: 115
GFR calc non Af Amer: 100
Globulin: 2.2

## 2017-11-25 IMAGING — DX DG WRIST COMPLETE 3+V*L*
4 series · 4 of 4 positions shown · non-contrast
Comparison: None.

CLINICAL DATA: Left wrist pain.

EXAM:
LEFT WRIST - COMPLETE 3+ VIEW

[wrist pa]
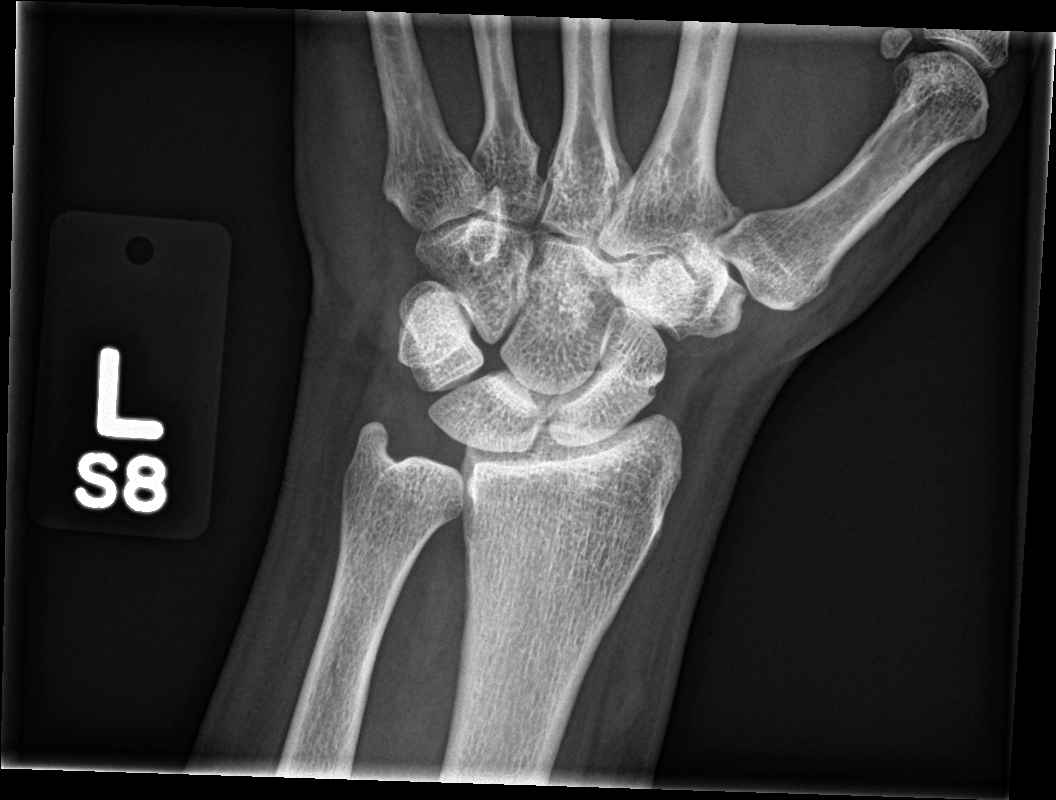

[wrist navicular]
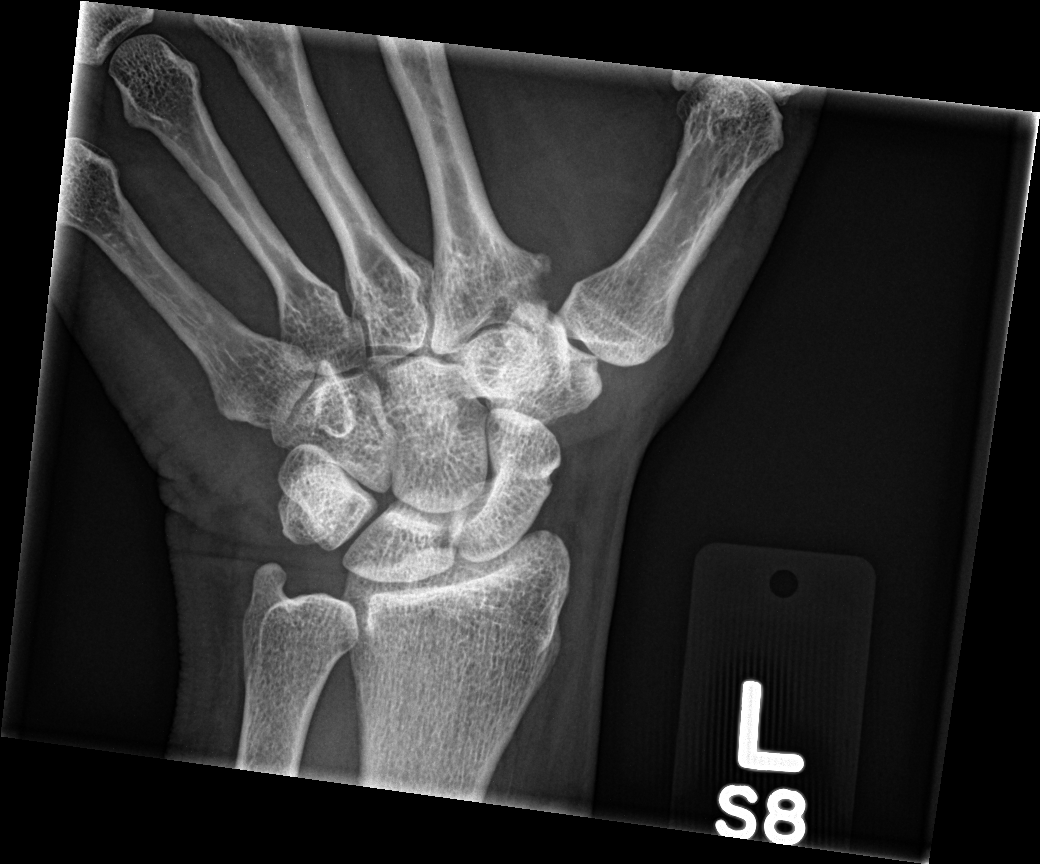

[wrist obl]
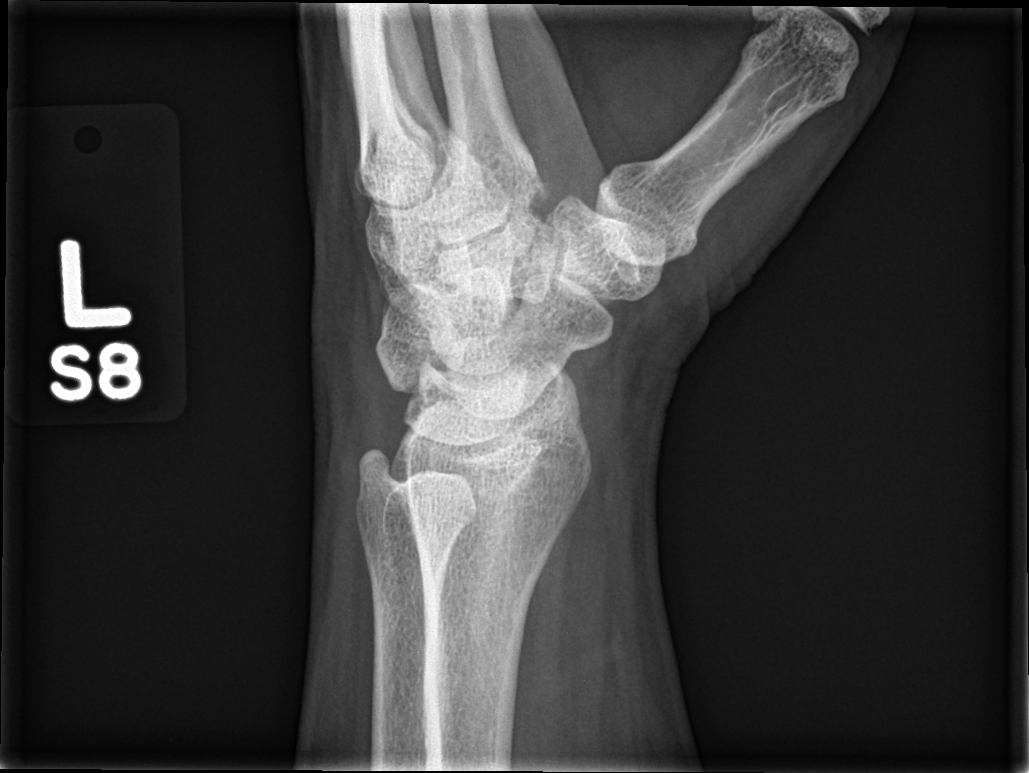

[wrist lat]
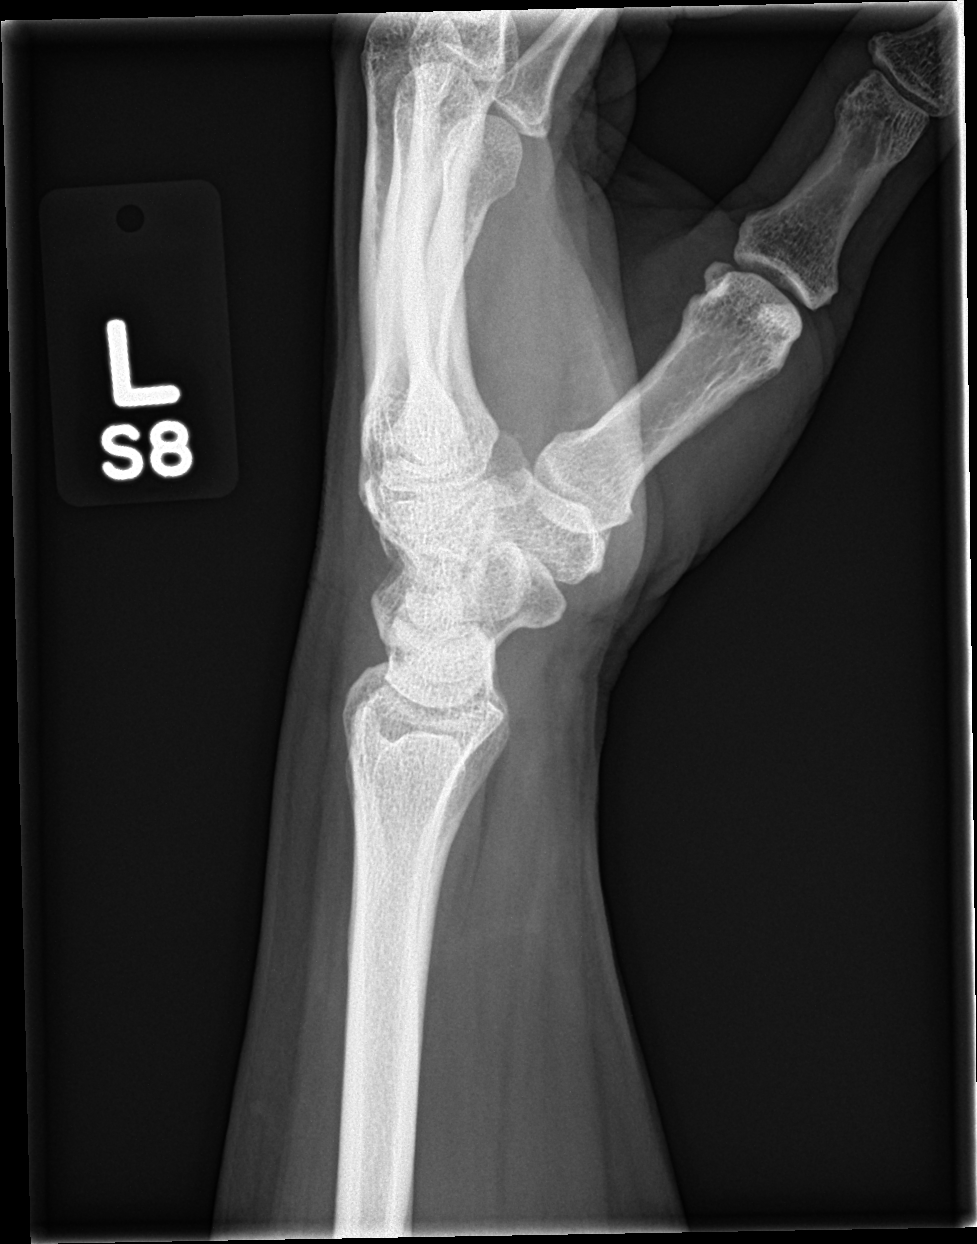

[4 of 4 positions shown; findings below may reference images not displayed]

FINDINGS: There mild degenerate changes of the first and second
carpometacarpal joints. No evidence of fracture dislocation.
IMPRESSION: No acute findings.

## 2018-03-23 ENCOUNTER — Encounter: Payer: Self-pay | Admitting: Pulmonary Disease

## 2018-03-23 ENCOUNTER — Other Ambulatory Visit (HOSPITAL_COMMUNITY): Payer: Self-pay | Admitting: Pulmonary Disease

## 2018-03-23 DIAGNOSIS — Z780 Asymptomatic menopausal state: Secondary | ICD-10-CM

## 2018-04-04 ENCOUNTER — Encounter (HOSPITAL_COMMUNITY): Payer: Self-pay | Admitting: Radiology

## 2018-04-04 ENCOUNTER — Ambulatory Visit (HOSPITAL_COMMUNITY)
Admission: RE | Admit: 2018-04-04 | Discharge: 2018-04-04 | Disposition: A | Discharge status: Home / Self Care | Payer: Medicaid Other | Source: Ambulatory Visit | Attending: Pulmonary Disease | Admitting: Pulmonary Disease

## 2018-04-04 DIAGNOSIS — Z780 Asymptomatic menopausal state: Secondary | ICD-10-CM | POA: Insufficient documentation

## 2018-04-04 LAB — HM DEXA SCAN: HM Dexa Scan: NORMAL

## 2018-11-29 LAB — IRON,TIBC AND FERRITIN PANEL
%SAT: 16
Ferritin: 31
Iron: 55
TIBC: 335

## 2018-11-29 LAB — CBC AND DIFFERENTIAL
HCT: 39 (ref 36–46)
Hemoglobin: 12.3 (ref 12.0–16.0)
Neutrophils Absolute: 6192
Platelets: 342 (ref 150–399)
WBC: 9.6

## 2018-11-29 LAB — HEMOGLOBIN A1C: Hemoglobin A1C: 8.3

## 2018-11-29 LAB — CBC: RBC: 4.55 (ref 3.87–5.11)

## 2019-05-26 DIAGNOSIS — J4540 Moderate persistent asthma, uncomplicated: Secondary | ICD-10-CM | POA: Insufficient documentation

## 2019-05-26 DIAGNOSIS — J449 Chronic obstructive pulmonary disease, unspecified: Secondary | ICD-10-CM | POA: Insufficient documentation

## 2019-05-26 DIAGNOSIS — J309 Allergic rhinitis, unspecified: Secondary | ICD-10-CM | POA: Insufficient documentation

## 2019-05-26 DIAGNOSIS — M545 Low back pain, unspecified: Secondary | ICD-10-CM | POA: Insufficient documentation

## 2019-05-26 DIAGNOSIS — T7840XS Allergy, unspecified, sequela: Secondary | ICD-10-CM | POA: Insufficient documentation

## 2019-05-26 DIAGNOSIS — M5000 Cervical disc disorder with myelopathy, unspecified cervical region: Secondary | ICD-10-CM

## 2019-05-26 DIAGNOSIS — K2100 Gastro-esophageal reflux disease with esophagitis, without bleeding: Secondary | ICD-10-CM

## 2019-05-26 DIAGNOSIS — M25532 Pain in left wrist: Secondary | ICD-10-CM

## 2019-05-26 DIAGNOSIS — E1165 Type 2 diabetes mellitus with hyperglycemia: Secondary | ICD-10-CM | POA: Insufficient documentation

## 2019-05-26 DIAGNOSIS — F419 Anxiety disorder, unspecified: Secondary | ICD-10-CM | POA: Insufficient documentation

## 2019-05-26 DIAGNOSIS — E785 Hyperlipidemia, unspecified: Secondary | ICD-10-CM

## 2020-04-29 ENCOUNTER — Encounter: Payer: Self-pay | Admitting: Orthopaedic Surgery

## 2020-09-16 ENCOUNTER — Other Ambulatory Visit: Payer: Self-pay | Admitting: Family Medicine

## 2020-09-16 DIAGNOSIS — Z1231 Encounter for screening mammogram for malignant neoplasm of breast: Secondary | ICD-10-CM

## 2022-08-05 ENCOUNTER — Encounter: Payer: Self-pay | Admitting: Radiology

## 2023-11-16 ENCOUNTER — Other Ambulatory Visit (HOSPITAL_COMMUNITY): Payer: Self-pay | Admitting: Internal Medicine

## 2023-11-16 DIAGNOSIS — Z780 Asymptomatic menopausal state: Secondary | ICD-10-CM
